# Patient Record
Sex: Female | Born: 1995 | Race: White | Hispanic: No | Marital: Single | Smoking: Former smoker
Health system: Southern US, Community
[De-identification: ages and names within clinical notes are randomized; demographics above are authoritative.]

## PROBLEM LIST (undated history)

## (undated) HISTORY — PX: TONSILLECTOMY: SUR1361

## (undated) HISTORY — PX: APPENDECTOMY: SHX54

---
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
## 2016-10-27 ENCOUNTER — Encounter: Payer: Self-pay | Admitting: Podiatry

## 2016-10-27 ENCOUNTER — Ambulatory Visit (INDEPENDENT_AMBULATORY_CARE_PROVIDER_SITE_OTHER): Payer: 59 | Admitting: Podiatry

## 2016-10-27 ENCOUNTER — Ambulatory Visit (INDEPENDENT_AMBULATORY_CARE_PROVIDER_SITE_OTHER): Payer: 59

## 2016-10-27 VITALS — BP 110/61 | HR 63 | Resp 16

## 2016-10-27 DIAGNOSIS — M7672 Peroneal tendinitis, left leg: Secondary | ICD-10-CM

## 2016-10-27 DIAGNOSIS — M79672 Pain in left foot: Secondary | ICD-10-CM

## 2016-10-27 MED ORDER — MELOXICAM 15 MG PO TABS: 15.0000 mg | ORAL_TABLET | Freq: Every day | ORAL | 3 refills | Status: DC

## 2016-10-27 MED ORDER — METHYLPREDNISOLONE 4 MG PO TBPK: ORAL_TABLET | ORAL | 0 refills | Status: DC

## 2016-10-27 NOTE — Progress Notes (Signed)
She presents today with a chief complaint of pain to her left lateral foot and states that his tensor now for the past 3-4 weeks. 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 She states that she's had some relief with ice and ibuprofen.  Objective: Vital signs are stable alert and oriented 3 pulses are palpable. Neurologic sensorium is intact. Deep tendon reflexes are intact. Muscle strength is 5 over 5 dorsiflexion plantar flexors and inverters inverters OF the musculature is intact. Orthopedic evaluation of his joints all joints distal to the ankle for range of motion I crepitus. She has pain on palpation of the peroneal tendons particularly Perrone is brevis and abduction against resistance is tender. Radiographs taken today do demonstrate what appears to be a thickening of her [place] Kuerrone is brevis or Perrone is longus tendon with considerable sclerotic tissue. A see no fractures of the fifth metatarsal base that she does have some tenderness on palpation of this area.  Assessment: Peroneal tendinitis left foot.  Plan: Placed her in a tall cam walker started her on a Medrol Dosepak to be followed by meloxicam. Told her that she is to wear this Cam Walker 95% of the time and I will follow-up with her in 1 month. If this is not improved and MRI will be necessary.

## 2016-12-01 ENCOUNTER — Ambulatory Visit: Payer: 59 | Admitting: Podiatry

## 2018-01-25 ENCOUNTER — Encounter: Payer: Self-pay | Admitting: Obstetrics and Gynecology

## 2018-01-25 ENCOUNTER — Ambulatory Visit (INDEPENDENT_AMBULATORY_CARE_PROVIDER_SITE_OTHER): Payer: Self-pay | Admitting: Obstetrics and Gynecology

## 2018-01-25 VITALS — BP 122/74 | HR 85 | Ht 65.0 in | Wt 141.0 lb

## 2018-01-25 DIAGNOSIS — Z30015 Encounter for initial prescription of vaginal ring hormonal contraceptive: Secondary | ICD-10-CM

## 2018-01-25 DIAGNOSIS — Z01419 Encounter for gynecological examination (general) (routine) without abnormal findings: Secondary | ICD-10-CM

## 2018-01-25 DIAGNOSIS — Z0000 Encounter for general adult medical examination without abnormal findings: Secondary | ICD-10-CM

## 2018-01-25 DIAGNOSIS — Z124 Encounter for screening for malignant neoplasm of cervix: Secondary | ICD-10-CM

## 2018-01-25 DIAGNOSIS — Z113 Encounter for screening for infections with a predominantly sexual mode of transmission: Secondary | ICD-10-CM

## 2018-01-25 MED ORDER — ETONOGESTREL-ETHINYL ESTRADIOL 0.12-0.015 MG/24HR VA RING: VAGINAL_RING | VAGINAL | 3 refills | Status: DC

## 2018-01-25 NOTE — Progress Notes (Signed)
PCP:  Patient, No Pcp Per   Chief Complaint  Patient presents with  . Contraception    BC consultation, Intrested in Nuvaring     HPI:      [name] is a 22 y.o. G0P0000 who LMP was Patient's last menstrual period was 01/02/2018 (exact date)., presents today for her annual examination.  Her menses are regular every 28-30 days, lasting 7 days.  Dysmenorrhea mild, occurring first 1-2 days of flow. She does not have intermenstrual bleeding.  Sex activity: single partner, contraception - condoms. Would like nuvaring. Did OCPs for a few months in the past without side effects. No hx of DVTs/seizures/HTN. Last Pap: never  Hx of STDs: none  There is no FH of breast cancer. There is no FH of ovarian cancer. The patient does do self-breast exams.  Tobacco use: The patient denies current or previous tobacco use. Alcohol use: social drinker No drug use.  Exercise: very active  She does get adequate calcium and Vitamin D in her diet. Did 2 Gardasil vaccines but declines 3rd dose.    History reviewed. No pertinent past medical history.  Past Surgical History:  Procedure Laterality Date  . APPENDECTOMY    . TONSILLECTOMY      Family History  Problem Relation Age of Onset  . Heart failure Maternal Grandmother   . Diabetes Maternal Grandfather   . Diabetes Paternal Grandfather     Social History   Socioeconomic History  . Marital status: Single    Spouse name: Not on file  . Number of children: Not on file  . Years of education: Not on file  . Highest education level: Not on file  Occupational History  . Not on file  Social Needs  . Financial resource strain: Not on file  . Food insecurity:    Worry: Not on file    Inability: Not on file  . Transportation needs:    Medical: Not on file    Non-medical: Not on file  Tobacco Use  . Smoking status: Former Games developer  . Smokeless tobacco: Never Used  Substance and Sexual Activity  . Alcohol use: Yes   Alcohol/week: 1.2 - 1.8 oz    Types: 2 - 3 Glasses of wine per week    Comment: social drinker  . Drug use: No  . Sexual activity: Yes    Birth control/protection: Condom  Lifestyle  . Physical activity:    Days per week: Not on file    Minutes per session: Not on file  . Stress: Not on file  Relationships  . Social connections:    Talks on phone: Not on file    Gets together: Not on file    Attends religious service: Not on file    Active member of club or organization: Not on file    Attends meetings of clubs or organizations: Not on file    Relationship status: Not on file  . Intimate partner violence:    Fear of current or ex partner: Not on file    Emotionally abused: Not on file    Physically abused: Not on file    Forced sexual activity: Not on file  Other Topics Concern  . Not on file  Social History Narrative  . Not on file    Outpatient Medications Prior to Visit  Medication Sig Dispense Refill  . meloxicam (MOBIC) 15 MG tablet Take 1 tablet (15 mg total) by mouth daily. (Patient not taking: Reported on 01/25/2018)  30 tablet 3  . methylPREDNISolone (MEDROL DOSEPAK) 4 MG TBPK tablet 6 day dose pack - take as directed (Patient not taking: Reported on 01/25/2018) 21 tablet 0   No facility-administered medications prior to visit.     ROS:  Review of Systems  Constitutional: Negative for fatigue, fever and unexpected weight change.  Respiratory: Negative for cough, shortness of breath and wheezing.   Cardiovascular: Negative for chest pain, palpitations and leg swelling.  Gastrointestinal: Negative for blood in stool, constipation, diarrhea, nausea and vomiting.  Endocrine: Negative for cold intolerance, heat intolerance and polyuria.  Genitourinary: Negative for dyspareunia, dysuria, flank pain, frequency, genital sores, hematuria, menstrual problem, pelvic pain, urgency, vaginal bleeding, vaginal discharge and vaginal pain.  Musculoskeletal: Negative for back pain,  joint swelling and myalgias.  Skin: Negative for rash.  Neurological: Negative for dizziness, syncope, light-headedness, numbness and headaches.  Hematological: Negative for adenopathy.  Psychiatric/Behavioral: Negative for agitation, confusion, sleep disturbance and suicidal ideas. The patient is not nervous/anxious.    BREAST: No symptoms   Objective: BP 122/74   Pulse 85   Ht 5\' 5"  (1.651 m)   Wt 141 lb (64 kg)   LMP 01/02/2018 (Exact Date)   BMI 23.46 kg/m    Physical Exam  Constitutional: She is oriented to person, place, and time. She appears well-developed and well-nourished.  Genitourinary: Vagina normal and uterus normal. There is no rash or tenderness on the right labia. There is no rash or tenderness on the left labia. No erythema or tenderness in the vagina. No vaginal discharge found. Right adnexum does not display mass and does not display tenderness. Left adnexum does not display mass and does not display tenderness. Cervix does not exhibit motion tenderness or polyp. Uterus is not enlarged or tender.  Neck: Normal range of motion. No thyromegaly present.  Cardiovascular: Normal rate, regular rhythm and normal heart sounds.  No murmur heard. Pulmonary/Chest: Effort normal and breath sounds normal. Right breast exhibits no mass, no nipple discharge, no skin change and no tenderness. Left breast exhibits no mass, no nipple discharge, no skin change and no tenderness.  Abdominal: Soft. There is no tenderness. There is no guarding.  Musculoskeletal: Normal range of motion.  Neurological: She is alert and oriented to person, place, and time. No cranial nerve deficit.  Psychiatric: She has a normal mood and affect. Her behavior is normal.  Vitals reviewed.   Assessment/Plan: Encounter for annual routine gynecological examination  Cervical cancer screening - Plan: IGP,CtNgTv,rfx Aptima HPV ASCU  Screening for STD (sexually transmitted disease) - Plan: IGP,CtNgTv,rfx  Aptima HPV ASCU  Encounter for initial prescription of vaginal ring hormonal contraceptive - Nuvaring start with next menses. Condoms. Rx nuvaring/1 sample. - Plan: etonogestrel-ethinyl estradiol (NUVARING) 0.12-0.015 MG/24HR vaginal ring  Meds ordered this encounter  Medications  . etonogestrel-ethinyl estradiol (NUVARING) 0.12-0.015 MG/24HR vaginal ring    Sig: Insert vaginally and leave in place for 3 consecutive weeks, then remove for 1 week.    Dispense:  3 each    Refill:  3    Order Specific Question:   Supervising Provider    Answer:   Nadara MustardHARRIS, ROBERT P [[id number]]             GYN counsel STD prevention, family planning choices, adequate intake of calcium and vitamin D, diet and exercise     F/U  Return in about 1 year (around 01/26/2019).  Ataya Murdy B. Wacey Zieger, PA-C 01/25/2018 2:49 PM

## 2018-01-25 NOTE — Patient Instructions (Signed)
I value your feedback and entrusting us with your care. If you get a [facility] patient survey, I would appreciate you taking the time to let us know about your experience today. Thank you! 

## 2018-01-26 ENCOUNTER — Telehealth: Payer: Self-pay

## 2018-01-26 NOTE — Telephone Encounter (Signed)
Pt calling to notify that her nuva ring rx for 3 months would be $500 per pharmacy.   Left msg for pt to call back to discuss options and copay savings card available online. Pt cb # [phone]

## 2018-01-26 NOTE — Telephone Encounter (Signed)
Discussed with pt and she stated that her insurance does not cover any type of contraception. She is planning to get her own plan and be removed from her parents plan to qualify for better coverage. Pt really liked the idea of nuva ring and plans to start it in the next couple of weeks after menses. Pt to call if insurance does not change and will request different type of contraceptive. Advised pt she may have another sample if needed until new insurance goes into effect.

## 2018-01-28 LAB — IGP,CTNGTV,RFX APTIMA HPV ASCU
Chlamydia, Nuc. Acid Amp: NEGATIVE
GONOCOCCUS, NUC. ACID AMP: NEGATIVE
PAP Smear Comment: 0
Trich vag by NAA: NEGATIVE

## 2018-06-07 ENCOUNTER — Other Ambulatory Visit: Payer: Self-pay

## 2018-06-07 ENCOUNTER — Ambulatory Visit
Admission: EM | Admit: 2018-06-07 | Discharge: 2018-06-07 | Disposition: A | Discharge status: Home / Self Care | Payer: Self-pay | Attending: Family Medicine | Admitting: Family Medicine

## 2018-06-07 DIAGNOSIS — H5712 Ocular pain, left eye: Secondary | ICD-10-CM

## 2018-06-07 DIAGNOSIS — H1032 Unspecified acute conjunctivitis, left eye: Secondary | ICD-10-CM

## 2018-06-07 NOTE — ED Provider Notes (Signed)
[facility]    CSN: [id number] Arrival date & time: 06/07/18  1043     History   Chief Complaint Chief Complaint  Patient presents with  . Conjunctivitis    HPI Maureen Crawford is a 22 y.o. female.   HPI  22 year old female presents with left eye pain swelling with tenderness over the upper eyelid when pressed.  Present for about 2 weeks.  Worsened over the last couple of days.  Now she is complaining of sensitivity, increased conjunctivitis, tearing and clear discharge.  She admits to using her mother's polymyxin eyedrops for conjunctivitis for the last 3 days but has not noticed any improvement in her symptoms. Visuall acuity today is normal.  She does not wear contact lenses.  She does have a feeling of foreign body sensation.  States that the eye is very itchy        History reviewed. No pertinent past medical history.  There are no active problems to display for this patient.   Past Surgical History:  Procedure Laterality Date  . APPENDECTOMY    . TONSILLECTOMY      OB History    Gravida  0   Para  0   Term  0   Preterm  0   AB  0   Living  0     SAB  0   TAB  0   Ectopic  0   Multiple  0   Live Births  0            Home Medications    Prior to Admission medications   Medication Sig Start Date End Date Taking? Authorizing Provider  cetirizine (ZYRTEC) 10 MG tablet Take 10 mg by mouth daily.   Yes [provider]    Family History Family History  Problem Relation Age of Onset  . Heart failure Maternal Grandmother   . Diabetes Maternal Grandfather   . Diabetes Paternal Grandfather     Social History Social History   Tobacco Use  . Smoking status: Former Games developer  . Smokeless tobacco: Never Used  Substance Use Topics  . Alcohol use: Yes    Alcohol/week: 2.0 - 3.0 standard drinks    Types: 2 - 3 Glasses of wine per week    Comment: social drinker  . Drug use: No     Allergies   Patient has no  known allergies.   Review of Systems Review of Systems  Constitutional: Positive for activity change. Negative for appetite change, chills, fatigue and fever.  Eyes: Positive for photophobia, pain, discharge, redness and itching. Negative for visual disturbance.  All other systems reviewed and are negative.    Physical Exam Triage Vital Signs ED Triage Vitals  Enc Vitals Group     BP 06/07/18 1100 104/61     Pulse Rate 06/07/18 1100 67     Resp 06/07/18 1100 18     Temp 06/07/18 1100 99 F (37.2 C)     Temp Source 06/07/18 1100 Oral     SpO2 06/07/18 1100 99 %     Weight 06/07/18 1056 142 lb (64.4 kg)     Height 06/07/18 1056 5\' 5"  (1.651 m)     Head Circumference --      Peak Flow --      Pain Score 06/07/18 1056 4     Pain Loc --      Pain Edu? --      Excl. in GC? --  No data found.  Updated Vital Signs BP 104/61 (BP Location: Left Arm)   Pulse 67   Temp 99 F (37.2 C) (Oral)   Resp 18   Ht 5\' 5"  (1.651 m)   Wt 142 lb (64.4 kg)   LMP 05/14/2018   SpO2 99%   BMI 23.63 kg/m   Visual Acuity Right Eye Distance: 20/25(uncorrected) Left Eye Distance: 20/30(uncorrected) Bilateral Distance: 20/25(uncorrected)  Right Eye Near:   Left Eye Near:    Bilateral Near:     Physical Exam  Constitutional: She is oriented to person, place, and time. She appears well-developed and well-nourished. No distress.  HENT:  Head: Normocephalic.  Eyes: Pupils are equal, round, and reactive to light. EOM are normal. Right eye exhibits no discharge. Left eye exhibits discharge.  Visual acuity is normal.  20/25 left eye 20/30 bilateral 20/25 uncorrected.  He is erythematous.  Is tearing with clear tears.  Upper lid was everted showing no foreign body present.  Lashes appear normal with no inflammation or irritation.  Complaining of pain with ballottement of the globe.  The eye was anesthetized with tetracaine.  The patient did not feel that it completely removed her discomfort.  Fluorescein stain was utilized under lamp magnification showing no abnormality of the cornea.  Neck: Normal range of motion.  Musculoskeletal: Normal range of motion.  Neurological: She is alert and oriented to person, place, and time.  Skin: Skin is warm and dry. She is not diaphoretic.  Psychiatric: She has a normal mood and affect. Her behavior is normal. Judgment and thought content normal.  Nursing note and vitals reviewed.    UC Treatments / Results  Labs (all labs ordered are listed, but only abnormal results are displayed) Labs Reviewed - No data to display  EKG None  Radiology No results found.  Procedures Procedures (including critical care time)  Medications Ordered in UC Medications - No data to display  Initial Impression / Assessment and Plan / UC Course  I have reviewed the triage vital signs and the nursing notes.  Pertinent labs & imaging results that were available during my care of the patient were reviewed by me and considered in my medical decision making (see chart for details).   Discussion with the patient telling her that I do not have a good explanation of why she has the pain and conjunctivitis she is presenting with.  Because of this I have arranged an appointment for her at 66 today with [facility] for further evaluation.  Also has not had any relief with the bacterial conjunctivitis eyedrops that she used in is more confusing.  Is agreeable to this and left in stable condition will keep the appointment at 89.   Final Clinical Impressions(s) / UC Diagnoses   Final diagnoses:  Acute left eye pain  Acute conjunctivitis of left eye, unspecified acute conjunctivitis type   Discharge Instructions   None    ED Prescriptions    None     Controlled Substance Prescriptions [place] Controlled Substance Registry consulted? Not Applicable   Lutricia Feil, PA-C 06/07/18 1223

## 2018-06-07 NOTE — ED Triage Notes (Signed)
Patient complains of left eye pain and swelling, tender to the touch x 2 weeks. Patient states that eye has been slowly worsening.

## 2018-06-07 NOTE — ED Notes (Signed)
Patient scheduled for an appointment with [facility] for today at 2:45pm.

## 2020-08-30 ENCOUNTER — Other Ambulatory Visit: Payer: Self-pay

## 2020-08-30 DIAGNOSIS — Z20822 Contact with and (suspected) exposure to covid-19: Secondary | ICD-10-CM

## 2020-09-03 LAB — NOVEL CORONAVIRUS, NAA: SARS-CoV-2, NAA: NOT DETECTED

## 2020-10-10 ENCOUNTER — Other Ambulatory Visit: Payer: Self-pay

## 2020-10-10 ENCOUNTER — Ambulatory Visit (INDEPENDENT_AMBULATORY_CARE_PROVIDER_SITE_OTHER): Payer: 59

## 2020-10-10 ENCOUNTER — Encounter: Payer: Self-pay | Admitting: Emergency Medicine

## 2020-10-10 ENCOUNTER — Ambulatory Visit
Admission: EM | Admit: 2020-10-10 | Discharge: 2020-10-10 | Disposition: A | Discharge status: Home / Self Care | Payer: 59 | Attending: Emergency Medicine | Admitting: Emergency Medicine

## 2020-10-10 DIAGNOSIS — S92351A Displaced fracture of fifth metatarsal bone, right foot, initial encounter for closed fracture: Secondary | ICD-10-CM

## 2020-10-10 MED ORDER — HYDROCODONE-ACETAMINOPHEN 5-325 MG PO TABS: 2.0000 | ORAL_TABLET | ORAL | 0 refills | Status: DC | PRN

## 2020-10-10 NOTE — ED Triage Notes (Addendum)
Patient states she rolled her rolled her right foot this morning. She is c/o pain and swelling to the right foot.

## 2020-10-10 NOTE — Discharge Instructions (Addendum)
Follow up with your orthopedist this week. No barring weight til you are cleared by ortho You may take Ibuprofen up to 800 mg every 8 h for pain Ice for 15-20 minutes 3-5 times a day and elevate for the next 48 hours.

## 2020-10-10 NOTE — ED Provider Notes (Signed)
[facility]    CSN: [id number] Arrival date & time: 10/10/20  1155      History   Chief Complaint Chief Complaint  Patient presents with  . Foot Pain    HPI Maureen Crawford is a 25 y.o. female who presents with R foot pain since she rolled int this am. She was walking on flat area with dress shoes. Has never injured this foot before. Has a lot of pain to bare wt.     History reviewed. No pertinent past medical history.  There are no problems to display for this patient.   Past Surgical History:  Procedure Laterality Date  . APPENDECTOMY    . TONSILLECTOMY      OB History    Gravida  0   Para  0   Term  0   Preterm  0   AB  0   Living  0     SAB  0   IAB  0   Ectopic  0   Multiple  0   Live Births  0            Home Medications    Prior to Admission medications   Medication Sig Start Date End Date Taking? Authorizing Provider  cetirizine (ZYRTEC) 10 MG tablet Take 10 mg by mouth daily.  10/10/20  [provider]    Family History Family History  Problem Relation Age of Onset  . Heart failure Maternal Grandmother   . Diabetes Maternal Grandfather   . Diabetes Paternal Grandfather     Social History Social History   Tobacco Use  . Smoking status: Former Games developer  . Smokeless tobacco: Never Used  Vaping Use  . Vaping Use: Never used  Substance Use Topics  . Alcohol use: Yes    Alcohol/week: 2.0 - 3.0 standard drinks    Types: 2 - 3 Glasses of wine per week    Comment: social drinker  . Drug use: No     Allergies   Patient has no known allergies.   Review of Systems Review of Systems  Musculoskeletal: Positive for arthralgias.  Skin: Positive for color change. Negative for pallor, rash and wound.  Neurological: Negative for numbness.     Physical Exam Triage Vital Signs ED Triage Vitals  Enc Vitals Group     BP 10/10/20 1245 (!) 150/70     Pulse Rate 10/10/20 1245 77     Resp 10/10/20 1245 18      Temp 10/10/20 1245 98 F (36.7 C)     Temp Source 10/10/20 1245 Oral     SpO2 10/10/20 1245 100 %     Weight 10/10/20 1244 150 lb (68 kg)     Height 10/10/20 1244 5\' 5"  (1.651 m)     Head Circumference --      Peak Flow --      Pain Score 10/10/20 1243 4     Pain Loc --      Pain Edu? --      Excl. in GC? --    No data found.  Updated Vital Signs BP (!) 150/70 (BP Location: Left Arm)   Pulse 77   Temp 98 F (36.7 C) (Oral)   Resp 18   Ht 5\' 5"  (1.651 m)   Wt 150 lb (68 kg)   LMP 10/08/2020   SpO2 100%   BMI 24.96 kg/m   Visual Acuity Right Eye Distance:   Left Eye Distance:   Bilateral  Distance:    Right Eye Near:   Left Eye Near:    Bilateral Near:     Physical Exam Vitals and nursing note reviewed.  Constitutional:      Appearance: She is normal weight.     Comments: Sits on wheelchair  HENT:     Head: Normocephalic.     Right Ear: External ear normal.     Left Ear: External ear normal.  Eyes:     General: No scleral icterus.    Conjunctiva/sclera: Conjunctivae normal.  Pulmonary:     Effort: Pulmonary effort is normal.  Musculoskeletal:        General: Swelling, tenderness and signs of injury present.     Cervical back: Neck supple.     Right lower leg: No edema.     Left lower leg: No edema.     Comments: R FOOT- with moderate swelling and ecchymosis on mid lateral foot. Has point tenderness of proximal 5th metatarsal  Skin:    General: Skin is warm and dry.     Findings: Bruising present. No erythema.  Neurological:     Mental Status: She is alert and oriented to person, place, and time.     Gait: Gait abnormal.  Psychiatric:        Mood and Affect: Mood normal.        Behavior: Behavior normal.        Thought Content: Thought content normal.        Judgment: Judgment normal.      UC Treatments / Results  Labs (all labs ordered are listed, but only abnormal results are displayed) Labs Reviewed - No data to  display  EKG   Radiology DG Foot Complete Right  Result Date: 10/10/2020 CLINICAL DATA:  Pain following rolling injury EXAM: RIGHT FOOT COMPLETE - 3+ VIEW COMPARISON:  None FINDINGS: Frontal, oblique, and lateral views were obtained. There is a transversely oriented fracture of the proximal aspect of the fifth metatarsal with mild separation of fracture fragments in this area. No other fracture. No dislocation. Joint spaces appear normal. No erosive change. IMPRESSION: Fracture proximal fifth metatarsal with mild separation of fracture fragments in this area. No other fractures. No dislocation. No appreciable arthropathy. These results will be called to the ordering clinician or representative by the Radiologist Assistant, and communication documented in the PACS or [employer]. Electronically Signed   By: Bretta Bang III M.D.   On: 10/10/2020 13:19    Procedures Procedures (including critical care time)  Medications Ordered in UC Medications - No data to display  Initial Impression / Assessment and Plan / UC Course  I have reviewed the triage vital signs and the nursing notes. Pertinent  imaging results that were available during my care of the patient were reviewed by me and considered in my medical decision making (see chart for details). Has R 5th metatarsal fracture. Was placed on a walker boot and crutches. She declined norco, she will just take OTC pain meds. Needs to Fu with her ortho this week  Final Clinical Impressions(s) / UC Diagnoses   Final diagnoses:  Closed displaced fracture of fifth metatarsal bone of right foot, initial encounter     Discharge Instructions     Follow up with your orthopedist this week. No barring weight til you are cleared by ortho You may take Ibuprofen up to 800 mg every 8 h for pain Ice for 15-20 minutes 3-5 times a day and elevate for the next 48  hours.     ED Prescriptions    Medication Sig Dispense Auth. Provider    HYDROcodone-acetaminophen (NORCO/VICODIN) 5-325 MG tablet  (Status: Discontinued) Take 2 tablets by mouth every 4 (four) hours as needed. 10 tablet Rodriguez-Southworth, Nettie Elm, PA-C     I have reviewed the PDMP during this encounter.   Garey Ham, Cordelia Poche 10/10/20 2059

## 2022-05-25 ENCOUNTER — Ambulatory Visit
Admission: EM | Admit: 2022-05-25 | Discharge: 2022-05-25 | Disposition: A | Discharge status: Home / Self Care | Payer: 59 | Attending: Internal Medicine | Admitting: Internal Medicine

## 2022-05-25 DIAGNOSIS — N3000 Acute cystitis without hematuria: Secondary | ICD-10-CM | POA: Diagnosis not present

## 2022-05-25 LAB — URINALYSIS, ROUTINE W REFLEX MICROSCOPIC
Bilirubin Urine: NEGATIVE
Glucose, UA: NEGATIVE mg/dL
Ketones, ur: NEGATIVE mg/dL
Nitrite: NEGATIVE
Protein, ur: NEGATIVE mg/dL
Specific Gravity, Urine: 1.015 (ref 1.005–1.030)
pH: 7 (ref 5.0–8.0)

## 2022-05-25 LAB — URINALYSIS, MICROSCOPIC (REFLEX): WBC, UA: >50 WBC/hpf (ref 0–5)

## 2022-05-25 MED ORDER — FLUCONAZOLE 150 MG PO TABS: 150.0000 mg | ORAL_TABLET | Freq: Every day | ORAL | 0 refills | Status: AC

## 2022-05-25 MED ORDER — NITROFURANTOIN MONOHYD MACRO 100 MG PO CAPS: 100.0000 mg | ORAL_CAPSULE | Freq: Two times a day (BID) | ORAL | 0 refills | Status: AC

## 2022-05-25 NOTE — ED Triage Notes (Signed)
Pt c/o possible UTI x5days.  Pt states that after urinating she has a burning sensation.  Pt states that she does not drink enough water and she holds her urine while at work.

## 2022-05-25 NOTE — ED Provider Notes (Signed)
[facility]    CSN: [id number] Arrival date & time: 05/25/22  0850      History   Chief Complaint Chief Complaint  Patient presents with   Urinary Tract Infection    HPI Maureen Crawford is a 26 y.o. female.   Patient presents with urinary frequency, dysuria and incomplete bladder emptying for 5 days.  Has not attempted treatment of symptoms.  Sexually active, no known exposure.  Last menstrual period 05/10/2022.  Denies hematuria, urgency, lower abdominal pain or pressure, flank pain, fever, chills, vaginal discharge, itching or odor.  History reviewed. No pertinent past medical history.  There are no problems to display for this patient.   Past Surgical History:  Procedure Laterality Date   APPENDECTOMY     TONSILLECTOMY      OB History     Gravida  0   Para  0   Term  0   Preterm  0   AB  0   Living  0      SAB  0   IAB  0   Ectopic  0   Multiple  0   Live Births  0            Home Medications    Prior to Admission medications   Medication Sig Start Date End Date Taking? Authorizing Provider  cetirizine (ZYRTEC) 10 MG tablet Take 10 mg by mouth daily.  10/10/20  [provider]    Family History Family History  Problem Relation Age of Onset   Heart failure Maternal Grandmother    Diabetes Maternal Grandfather    Diabetes Paternal Grandfather     Social History Social History   Tobacco Use   Smoking status: Former   Smokeless tobacco: Never  Building services engineer Use: Never used  Substance Use Topics   Alcohol use: Yes    Alcohol/week: 2.0 - 3.0 standard drinks of alcohol    Types: 2 - 3 Glasses of wine per week    Comment: social drinker   Drug use: No     Allergies   Patient has no known allergies.   Review of Systems Review of Systems  Constitutional: Negative.   Respiratory: Negative.    Genitourinary:  Positive for difficulty urinating, dysuria and frequency. Negative for decreased urine  volume, dyspareunia, enuresis, flank pain, genital sores, hematuria, menstrual problem, pelvic pain, urgency, vaginal bleeding, vaginal discharge and vaginal pain.     Physical Exam Triage Vital Signs ED Triage Vitals  Enc Vitals Group     BP 05/25/22 0905 (!) 141/93     Pulse Rate 05/25/22 0905 (!) 58     Resp 05/25/22 0905 18     Temp 05/25/22 0905 98.1 F (36.7 C)     Temp Source 05/25/22 0905 Oral     SpO2 05/25/22 0905 100 %     Weight 05/25/22 0904 150 lb (68 kg)     Height 05/25/22 0904 5\' 5"  (1.651 m)     Head Circumference --      Peak Flow --      Pain Score 05/25/22 0903 3     Pain Loc --      Pain Edu? --      Excl. in GC? --    No data found.  Updated Vital Signs BP (!) 141/93 (BP Location: Left Arm)   Pulse (!) 58   Temp 98.1 F (36.7 C) (Oral)   Resp 18   Ht  5\' 5"  (1.651 m)   Wt 150 lb (68 kg)   LMP 05/10/2022   SpO2 100%   BMI 24.96 kg/m   Visual Acuity Right Eye Distance:   Left Eye Distance:   Bilateral Distance:    Right Eye Near:   Left Eye Near:    Bilateral Near:     Physical Exam Constitutional:      Appearance: Normal appearance.  HENT:     Head: Normocephalic.  Eyes:     Extraocular Movements: Extraocular movements intact.  Pulmonary:     Effort: Pulmonary effort is normal.  Abdominal:     General: Abdomen is flat. Bowel sounds are normal.     Palpations: Abdomen is soft.     Tenderness: There is abdominal tenderness in the suprapubic area.  Neurological:     Mental Status: She is alert and oriented to person, place, and time. Mental status is at baseline.  Psychiatric:        Mood and Affect: Mood normal.        Behavior: Behavior normal.      UC Treatments / Results  Labs (all labs ordered are listed, but only abnormal results are displayed) Labs Reviewed  URINALYSIS, [facility] MICROSCOPIC    EKG   Radiology No results found.  Procedures Procedures (including critical care time)  Medications  Ordered in UC Medications - No data to display  Initial Impression / Assessment and Plan / UC Course  I have reviewed the triage vital signs and the nursing notes.  Pertinent labs & imaging results that were available during my care of the patient were reviewed by me and considered in my medical decision making (see chart for details).  Acute cystitis without hematuria  Urinalysis showing leukocytes, bacteria and examined microscope, sent for culture, discussed with patient, Macrobid and Diflucan prescribed, advised use of over-the-counter Pyridium, analgesics, increase fluid intake and good hygiene for additional supportive measures, advised to follow-up with urgent care if symptoms persist or recur Final Clinical Impressions(s) / UC Diagnoses   Final diagnoses:  None   Discharge Instructions   None    ED Prescriptions   None    PDMP not reviewed this encounter.   Hans Eden, NP 05/25/22 [phone]

## 2022-05-25 NOTE — Discharge Instructions (Signed)
Your urinalysis shows Savannha Welle blood cells and bacteria seen underneath the microscope which are indicative of infection, yeast is also present under the microscope.  Your urine will be sent to the lab to determine exactly which bacteria is present, if any changes need to be made to your medications you will be notified  Begin use of Macrobid every morning and every evening for 5 days  Take 1 Diflucan tablet then after completion of antibiotics take second dose  You may use over-the-counter Pyridium to help minimize your symptoms until antibiotic removes bacteria, this medication will turn your urine orange  Increase your fluid intake through use of water  As always practice good hygiene, wiping front to back and avoidance of scented vaginal products to prevent further irritation  If symptoms continue to persist after use of medication or recur please follow-up with urgent care or your primary doctor as needed

## 2022-05-27 LAB — URINE CULTURE: Culture: >=100000 — AB

## 2022-09-12 IMAGING — CR DG FOOT COMPLETE 3+V*R*
3 series · 3 of 3 positions shown · non-contrast
Comparison: None

CLINICAL DATA: Pain following rolling injury

EXAM:
RIGHT FOOT COMPLETE - 3+ VIEW

[foot ap]
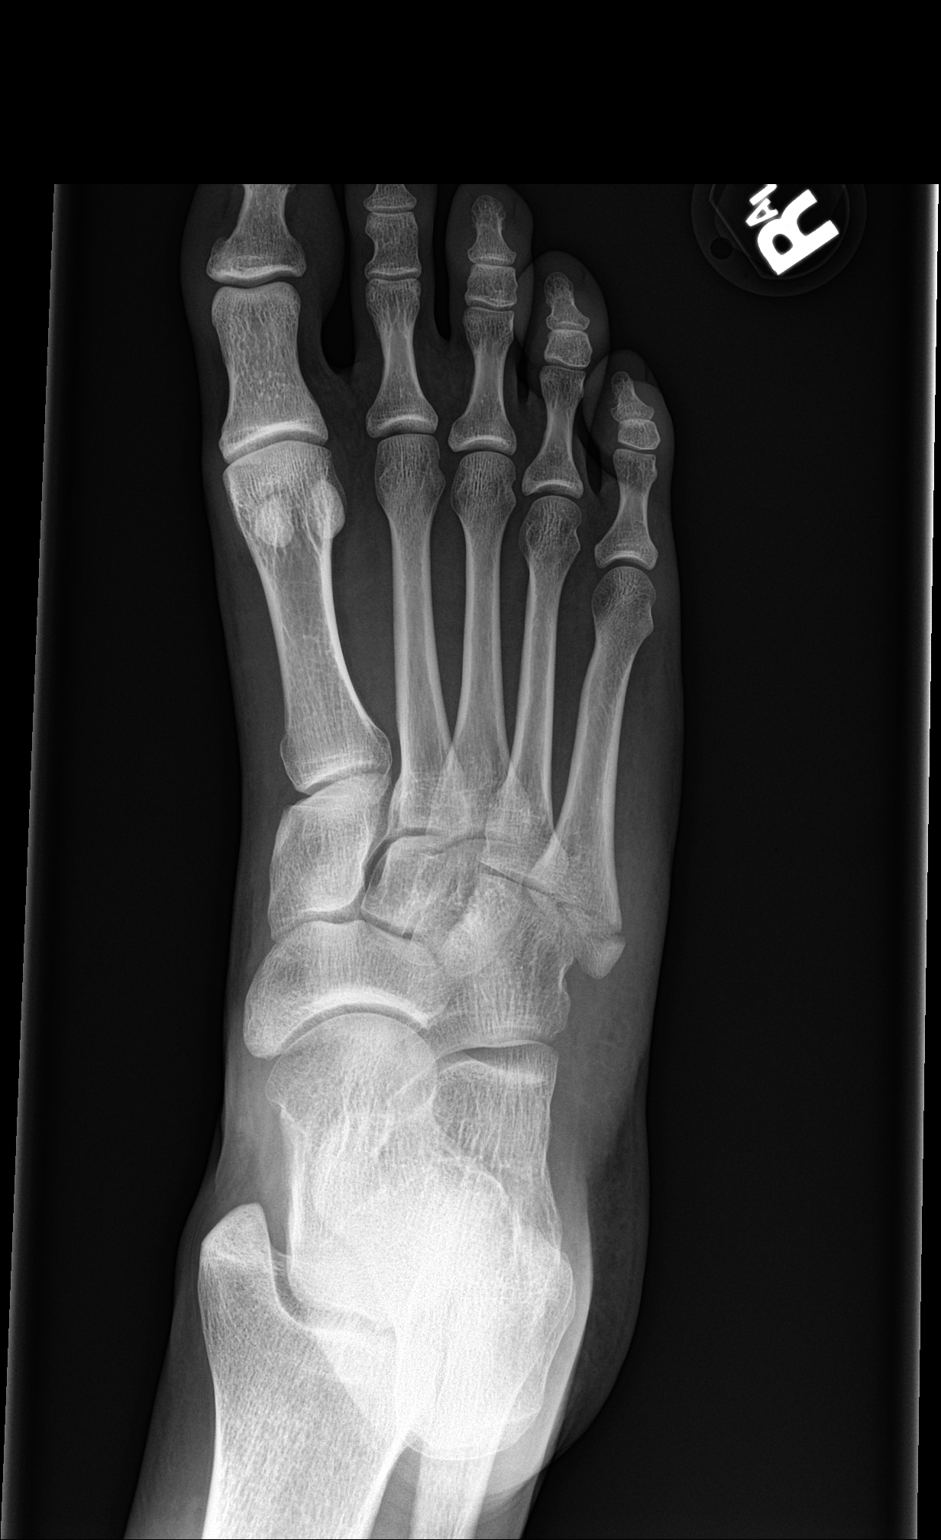

[foot obl]
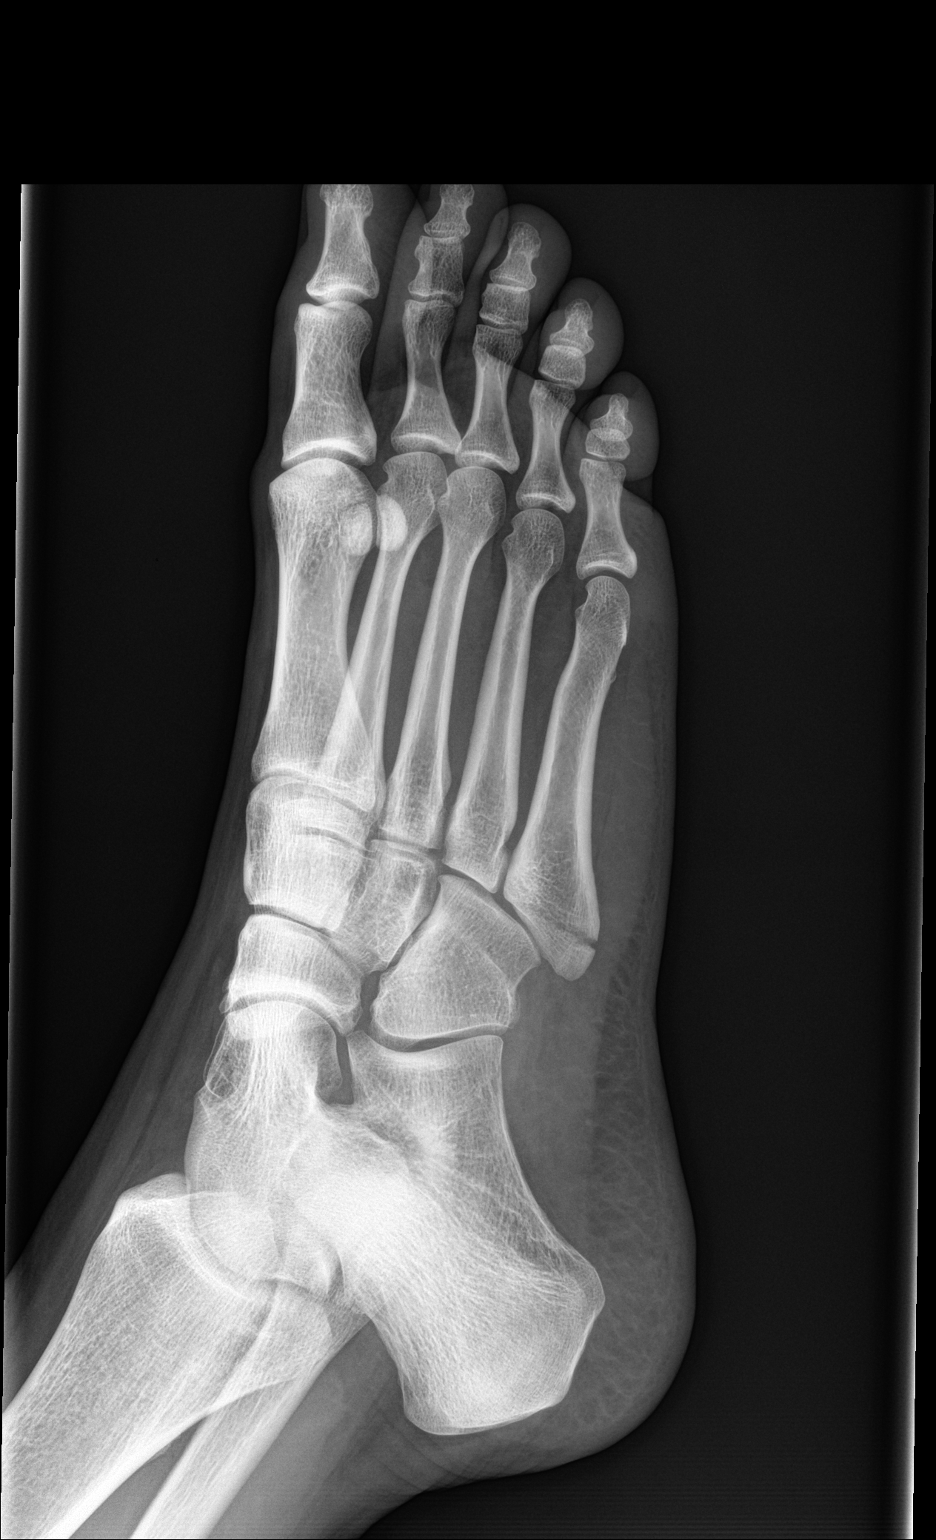

[foot lat]
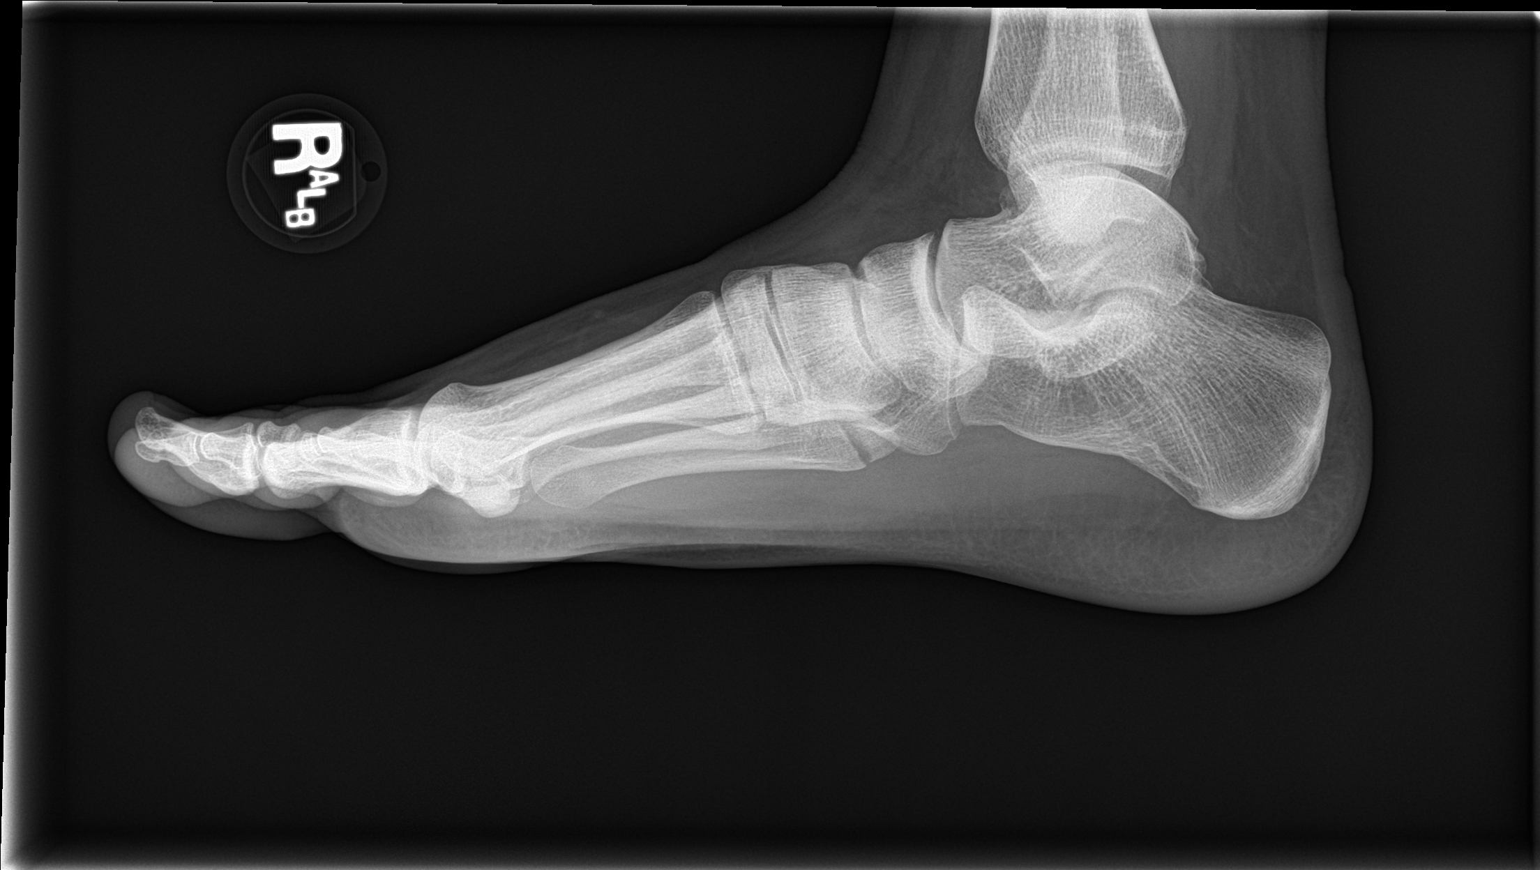

[3 of 3 positions shown; findings below may reference images not displayed]

FINDINGS: Frontal, oblique, and lateral views were obtained. There is a
transversely oriented fracture of the proximal aspect of the fifth
metatarsal with mild separation of fracture fragments in this area.
No other fracture. No dislocation. Joint spaces appear normal. No
erosive change.
IMPRESSION: Fracture proximal fifth metatarsal with mild separation of fracture
fragments in this area. No other fractures. No dislocation. No
appreciable arthropathy.

These results will be called to the ordering clinician or
representative by the Radiologist Assistant, and communication
documented in the PACS or [REDACTED].

## 2023-02-19 ENCOUNTER — Ambulatory Visit
Admission: EM | Admit: 2023-02-19 | Discharge: 2023-02-19 | Disposition: A | Discharge status: Home / Self Care | Payer: 59

## 2023-02-19 DIAGNOSIS — S30861A Insect bite (nonvenomous) of abdominal wall, initial encounter: Secondary | ICD-10-CM | POA: Diagnosis not present

## 2023-02-19 DIAGNOSIS — W57XXXA Bitten or stung by nonvenomous insect and other nonvenomous arthropods, initial encounter: Secondary | ICD-10-CM

## 2023-02-19 NOTE — Discharge Instructions (Signed)
Recommend you continue to watch for signs of infection. Follow up here or with your primary care provider if your symptoms are worsening or not improving.

## 2023-02-19 NOTE — ED Provider Notes (Signed)
[facility]    CSN: [id number] Arrival date & time: 02/19/23  1334      History   Chief Complaint Chief Complaint  Patient presents with   Insect Bite    HPI Maureen Crawford is a 27 y.o. female.   HPI  Presents to urgent care with concern for "spider" bite to abdominal area about 5 days ago.  She endorses redness and purple color.  She reports a headache and "low-grade" fever.  Has been treating symptoms with ibuprofen and Tylenol.  History reviewed. No pertinent past medical history.  There are no problems to display for this patient.   Past Surgical History:  Procedure Laterality Date   APPENDECTOMY     TONSILLECTOMY      OB History     Gravida  0   Para  0   Term  0   Preterm  0   AB  0   Living  0      SAB  0   IAB  0   Ectopic  0   Multiple  0   Live Births  0            Home Medications    Prior to Admission medications   Medication Sig Start Date End Date Taking? Authorizing Provider  nitrofurantoin, macrocrystal-monohydrate, (MACROBID) 100 MG capsule Take 1 capsule (100 mg total) by mouth 2 (two) times daily. 05/25/22   White, Elita Boone, NP  cetirizine (ZYRTEC) 10 MG tablet Take 10 mg by mouth daily.  10/10/20  [provider]    Family History Family History  Problem Relation Age of Onset   Heart failure Maternal Grandmother    Diabetes Maternal Grandfather    Diabetes Paternal Grandfather     Social History Social History   Tobacco Use   Smoking status: Former   Smokeless tobacco: Never  Building services engineer Use: Never used  Substance Use Topics   Alcohol use: Yes    Alcohol/week: 2.0 - 3.0 standard drinks of alcohol    Types: 2 - 3 Glasses of wine per week    Comment: social drinker   Drug use: No     Allergies   Patient has no known allergies.   Review of Systems Review of Systems   Physical Exam Triage Vital Signs ED Triage Vitals  Enc Vitals Group     BP 02/19/23 1355 118/82      Pulse Rate 02/19/23 1355 83     Resp 02/19/23 1355 16     Temp 02/19/23 1355 98.1 F (36.7 C)     Temp Source 02/19/23 1355 Oral     SpO2 02/19/23 1355 98 %     Weight --      Height --      Head Circumference --      Peak Flow --      Pain Score 02/19/23 1344 2     Pain Loc --      Pain Edu? --      Excl. in GC? --    No data found.  Updated Vital Signs BP 118/82 (BP Location: Left Arm)   Pulse 83   Temp 98.1 F (36.7 C) (Oral)   Resp 16   SpO2 98%   Visual Acuity Right Eye Distance:   Left Eye Distance:   Bilateral Distance:    Right Eye Near:   Left Eye Near:    Bilateral Near:     Physical Exam  Vitals reviewed.  Constitutional:      Appearance: Normal appearance.  Abdominal:    Skin:    General: Skin is warm and dry.     Findings: Wound present.  Neurological:     General: No focal deficit present.     Mental Status: She is alert and oriented to person, place, and time.  Psychiatric:        Mood and Affect: Mood normal.        Behavior: Behavior normal.      UC Treatments / Results  Labs (all labs ordered are listed, but only abnormal results are displayed) Labs Reviewed - No data to display  EKG   Radiology No results found.  Procedures Procedures (including critical care time)  Medications Ordered in UC Medications - No data to display  Initial Impression / Assessment and Plan / UC Course  I have reviewed the triage vital signs and the nursing notes.  Pertinent labs & imaging results that were available during my care of the patient were reviewed by me and considered in my medical decision making (see chart for details).   Skin wound present from apparent insect bite. There is a central punctum (0.5 cm) in a state of partial healing (scabbed) with no discharge. There is a ring of mild erythema surrounding the central wound approx 3 mm wide. Not tender. An ink ring, drawn by the patient circumscribes the ring of  erythema.  Suspect local inflammatory reaction without evidence of systemic effect, necrosis or infection. No significant erythema, not advancing, not cellulitic, no discharge.  Recommending watch and wait.  Counseled patient on potential for adverse effects with medications prescribed/recommended today, ER and return-to-clinic precautions discussed, patient verbalized understanding and agreement with care plan.  Final Clinical Impressions(s) / UC Diagnoses   Final diagnoses:  None   Discharge Instructions   None    ED Prescriptions   None    PDMP not reviewed this encounter.   Charma Igo, [place] 02/19/23 1413

## 2023-02-19 NOTE — ED Triage Notes (Signed)
Patient presents to UC for spider bite to abdominal area x 5 day. States redness and purplish in color. States HA and low grade fever, treating with ibuprofen and tylenol.
